# Patient Record
Sex: Female | Born: 1935 | Race: White | Hispanic: No | Marital: Married | Smoking: Former smoker
Health system: Southern US, Community
[De-identification: ages and names within clinical notes are randomized; demographics above are authoritative.]

## PROBLEM LIST (undated history)

## (undated) DIAGNOSIS — H3530 Unspecified macular degeneration: Secondary | ICD-10-CM

## (undated) DIAGNOSIS — M1990 Unspecified osteoarthritis, unspecified site: Secondary | ICD-10-CM

## (undated) DIAGNOSIS — I4891 Unspecified atrial fibrillation: Secondary | ICD-10-CM

## (undated) HISTORY — PX: EYE SURGERY: SHX253

## (undated) HISTORY — PX: STENT PLACEMENT VASCULAR (ARMC HX): HXRAD1737

## (undated) HISTORY — PX: CHOLECYSTECTOMY: SHX55

---

## 2009-01-20 ENCOUNTER — Ambulatory Visit: Payer: Self-pay | Admitting: Internal Medicine

## 2009-01-22 ENCOUNTER — Ambulatory Visit: Payer: Self-pay | Admitting: Internal Medicine

## 2018-12-11 ENCOUNTER — Other Ambulatory Visit: Payer: Self-pay

## 2018-12-11 ENCOUNTER — Encounter: Payer: Self-pay | Admitting: Physician Assistant

## 2018-12-11 ENCOUNTER — Emergency Department: Payer: Medicare Other

## 2018-12-11 ENCOUNTER — Emergency Department
Admission: EM | Admit: 2018-12-11 | Discharge: 2018-12-11 | Disposition: A | Discharge status: Home / Self Care | Payer: Medicare Other | Attending: Emergency Medicine | Admitting: Emergency Medicine

## 2018-12-11 ENCOUNTER — Encounter: Payer: Self-pay | Admitting: Emergency Medicine

## 2018-12-11 DIAGNOSIS — Z7901 Long term (current) use of anticoagulants: Secondary | ICD-10-CM | POA: Diagnosis not present

## 2018-12-11 DIAGNOSIS — Z7982 Long term (current) use of aspirin: Secondary | ICD-10-CM | POA: Insufficient documentation

## 2018-12-11 DIAGNOSIS — R55 Syncope and collapse: Secondary | ICD-10-CM | POA: Insufficient documentation

## 2018-12-11 DIAGNOSIS — Z79899 Other long term (current) drug therapy: Secondary | ICD-10-CM | POA: Diagnosis not present

## 2018-12-11 HISTORY — DX: Unspecified atrial fibrillation: I48.91

## 2018-12-11 HISTORY — DX: Unspecified osteoarthritis, unspecified site: M19.90

## 2018-12-11 HISTORY — DX: Unspecified macular degeneration: H35.30

## 2018-12-11 LAB — BASIC METABOLIC PANEL
Anion gap: 9 (ref 5–15)
BUN: 19 mg/dL (ref 8–23)
CO2: 23 mmol/L (ref 22–32)
Calcium: 8.6 mg/dL — ABNORMAL LOW (ref 8.9–10.3)
Chloride: 104 mmol/L (ref 98–111)
Creatinine, Ser: 0.78 mg/dL (ref 0.44–1.00)
GFR calc Af Amer: >60 mL/min (ref >60)
GFR calc non Af Amer: >60 mL/min (ref >60)
Glucose, Bld: 104 mg/dL — ABNORMAL HIGH (ref 70–99)
Potassium: 3.6 mmol/L (ref 3.5–5.1)
Sodium: 136 mmol/L (ref 135–145)

## 2018-12-11 LAB — URINALYSIS, COMPLETE (UACMP) WITH MICROSCOPIC
Bilirubin Urine: NEGATIVE
Glucose, UA: NEGATIVE mg/dL
Hgb urine dipstick: NEGATIVE
Ketones, ur: NEGATIVE mg/dL
Leukocytes,Ua: NEGATIVE
Nitrite: NEGATIVE
Protein, ur: NEGATIVE mg/dL
Specific Gravity, Urine: 1.004 — ABNORMAL LOW (ref 1.005–1.030)
pH: 6 (ref 5.0–8.0)

## 2018-12-11 LAB — CBC
HCT: 35.1 % — ABNORMAL LOW (ref 36.0–46.0)
Hemoglobin: 11.6 g/dL — ABNORMAL LOW (ref 12.0–15.0)
MCH: 31 pg (ref 26.0–34.0)
MCHC: 33 g/dL (ref 30.0–36.0)
MCV: 93.9 fL (ref 80.0–100.0)
Platelets: 266 10*3/uL (ref 150–400)
RBC: 3.74 MIL/uL — ABNORMAL LOW (ref 3.87–5.11)
RDW: 14.2 % (ref 11.5–15.5)
WBC: 8 10*3/uL (ref 4.0–10.5)
nRBC: 0 % (ref 0.0–0.2)

## 2018-12-11 LAB — TROPONIN I: Troponin I: <0.03 ng/mL (ref <0.03)

## 2018-12-11 NOTE — ED Triage Notes (Signed)
PT arrives via ems from home after near syncope. Pt denies a loc. Pt arrives a&o x 4 and denies pain. Pt reports recurrent episodes of near syncope for the "last few months."

## 2018-12-11 NOTE — ED Provider Notes (Signed)
-----------------------------------------   9:26 PM on 12/11/2018 -----------------------------------------  I evaluated this patient with the PA.  Patient presented with a near syncopal episode in which she slumped over on her side while sitting talking to her family.  She had a similar episode previously.  She did not fully lose consciousness, and did not fall or hit her head.  The patient has a history of atrial fibrillation and also describes an episode prior to moving to [place] which she had a black spot in her vision on the left which was thought to be due to a TIA.  On exam today the patient is very well-appearing for her age.  Her vital signs are normal.  Neuro exam is nonfocal.  The remainder of the exam is unremarkable.  EKG is nonischemic.  Overall presentation is consistent with vasovagal near syncope or other benign etiology.  We obtained a work-up including basic labs, troponin, urinalysis, as well as a CT head, all of which were within normal limits.  Although I have a very low suspicion for TIA based on this presentation, I did offer the patient observation admission overnight.  However the patient has a strong preference to go home and follow-up with her regular doctors as an outpatient.  She was stable at the time of discharge.  Return precautions were given, and she expressed understanding.  ED ECG REPORT I, Dionne Bucy, the attending physician, personally viewed and interpreted this ECG.  Date: 12/11/2018 EKG Time: 1826 Rate: 74 Rhythm: Atrial fibrillation QRS Axis: normal Intervals: normal ST/T Wave abnormalities: Nonspecific repolarization abnormality Narrative Interpretation: no evidence of acute ischemia    Dionne Bucy, MD 12/11/18 2221

## 2018-12-11 NOTE — Discharge Instructions (Addendum)
Your exam, labs, EKG, and CT scan are all within normal limits at this time. There is no clear cause for your near-syncopal episodes. Continue to take your home meds, and follow-up with your provider as needed. Return to the ED for any concerning symptoms.

## 2018-12-11 NOTE — ED Provider Notes (Addendum)
[facility] Emergency Department Provider Note ____________________________________________  Time seen: 2018  I have reviewed the triage vital signs and the nursing notes.  HISTORY  Chief Complaint  Near Syncope  HPI Gina Abbott is a 84 y.o. female presents to the ED via EMS from home where the patient had a witnessed near syncopal episode.  According to the patient, she was sitting outside when her family was around, when she recalls, slumping to the side of her chair.  She reports being awake and aware during the whole episode and denies any head injury or falling.  She is reporting similar episodes over the last month, where she has had near syncopal episodes.  The patient believes the episode occurred because she had gone too long without eating or drinking, after working in the yard this afternoon.  She denies any chest pain, shortness of breath, nausea, vomiting, or dizziness.  She denies any headache, weakness, or distal paresthesias.  Past Medical History:  Diagnosis Date  . Arthritis   . Atrial fibrillation (HCC)   . Macular degeneration     There are no active problems to display for this patient.   Past Surgical History:  Procedure Laterality Date  . CHOLECYSTECTOMY    . EYE SURGERY    . STENT PLACEMENT VASCULAR ([facility] HX)      Prior to Admission medications   Medication Sig Start Date End Date Taking? Authorizing Provider  apixaban (ELIQUIS) 5 MG TABS tablet Take 5 mg by mouth 2 (two) times daily.   Yes [provider]  aspirin EC 81 MG tablet Take 81 mg by mouth at bedtime.   Yes [provider]  atorvastatin (LIPITOR) 40 MG tablet Take 40 mg by mouth at bedtime.   Yes [provider]  b complex vitamins capsule Take 1 capsule by mouth daily.   Yes [provider]  calcium-vitamin D (OSCAL WITH D) 500-200 MG-UNIT tablet Take 1 tablet by mouth.   Yes [provider]  cholecalciferol (VITAMIN D3) 25  MCG (1000 UT) tablet Take 1,000 Units by mouth daily.   Yes [provider]  diltiazem (DILACOR XR) 240 MG 24 hr capsule Take 240 mg by mouth daily.   Yes [provider]  estrogens, conjugated, (PREMARIN) 0.625 MG tablet Take 0.625 mg by mouth 2 (two) times a week.   Yes [provider]  isosorbide dinitrate (ISORDIL) 30 MG tablet Take 30 mg by mouth daily after breakfast.   Yes [provider]  Multiple Vitamin (MULTIVITAMIN WITH MINERALS) TABS tablet Take 1 tablet by mouth daily.   Yes [provider]  Multiple Vitamins-Minerals (PRESERVISION AREDS 2+MULTI VIT) CAPS Take 1 tablet by mouth 2 (two) times a day.   Yes [provider]  omeprazole (PRILOSEC) 40 MG capsule Take 40 mg by mouth daily as needed.   Yes [provider]  oxybutynin (DITROPAN) 5 MG tablet Take 5 mg by mouth daily after supper.   Yes [provider]    Allergies Iodine  No family history on file.  Social History Social History   Tobacco Use  . Smoking status: Not on file  Substance Use Topics  . Alcohol use: Not on file  . Drug use: Not on file    Review of Systems  Constitutional: Negative for fever. Eyes: Negative for visual changes. ENT: Negative for sore throat. Cardiovascular: Negative for chest pain. Respiratory: Negative for shortness of breath. Gastrointestinal: Negative for abdominal pain, vomiting and diarrhea. Genitourinary:  Negative for dysuria. Musculoskeletal: Negative for back pain. Skin: Negative for rash. Neurological: Negative for headaches, focal weakness or numbness. ____________________________________________  PHYSICAL EXAM:  VITAL SIGNS: ED Triage Vitals  Enc Vitals Group     BP 12/11/18 1823 130/67     Pulse Rate 12/11/18 1823 66     Resp 12/11/18 1823 18     Temp 12/11/18 1823 97.7 F (36.5 C)     Temp Source 12/11/18 1823 Oral     SpO2 12/11/18 1823 99 %     Weight 12/11/18 1819 143 lb (64.9 kg)      Height 12/11/18 1819  (1.422 m)     Head Circumference --      Peak Flow --      Pain Score 12/11/18 1819 0     Pain Loc --      Pain Edu? --      Excl. in GC? --     Constitutional: Alert and oriented. Well appearing and in no distress.  Patient is pleasant, interactive, and loquacious. Head: Normocephalic and atraumatic. Eyes: Conjunctivae are normal. PERRL. Normal extraocular movements Ears: Canals clear. TMs intact bilaterally. Mouth/Throat: Mucous membranes are moist. Neck: Supple. Normal ROM Cardiovascular: Normal rate, regular rhythm. Normal distal pulses. Respiratory: Normal respiratory effort. No wheezes/rales/rhonchi. Gastrointestinal: Soft and nontender. No distention. Musculoskeletal: Nontender with normal range of motion in all extremities.  Neurologic:  CN II-XII grossly intact. Normal speech and language. No gross focal neurologic deficits are appreciated. Skin:  Skin is warm, dry and intact. No rash noted. Psychiatric: Mood and affect are normal. Patient exhibits appropriate insight and judgment. ____________________________________________   LABS (pertinent positives/negatives) Labs Reviewed  BASIC METABOLIC PANEL - Abnormal; Notable for the following components:      Result Value   Glucose, Bld 104 (*)    Calcium 8.6 (*)    All other components within normal limits  CBC - Abnormal; Notable for the following components:   RBC 3.74 (*)    Hemoglobin 11.6 (*)    HCT 35.1 (*)    All other components within normal limits  URINALYSIS, COMPLETE (UACMP) WITH MICROSCOPIC - Abnormal; Notable for the following components:   Color, Urine STRAW (*)    APPearance CLEAR (*)    Specific Gravity, Urine 1.004 (*)    Bacteria, UA RARE (*)    All other components within normal limits  TROPONIN I  ____________________________________________  EKG  Atrial Fibrillation Borderline right axis deviation ____________________________________________    RADIOLOGY  Head CT w/o CM IMPRESSION: No acute intracranial abnormality. Mild for age nonspecific cerebral white matter changes are most commonly due to chronic small vessel disease. ____________________________________________  PROCEDURES  Procedures ____________________________________________  INITIAL IMPRESSION / ASSESSMENT AND PLAN / ED COURSE  Gina Abbott was evaluated in Emergency Department on 12/11/2018 for the symptoms described in the history of present illness. She was evaluated in the context of the global COVID-19 pandemic, which necessitated consideration that the patient might be at risk for infection with the SARS-CoV-2 virus that causes COVID-19. Institutional protocols and algorithms that pertain to the evaluation of patients at risk for COVID-19 are in a state of rapid change based on information released by regulatory bodies including the CDC and federal and state organizations. These policies and algorithms were followed during the patient's care in the ED.  Geriatric patient with ED evaluation of a near syncopal episode witnessed by the family.  The patient is been alert & active since her ED arrival.  Labs are reassuring and head CT is negative for any acute intracranial process.  No clear etiology for the patient's near syncopal episode. She recovered completely without further deterioration or sequela.  She is without any acute lab findings, signs of sepsis or toxicity.  She will be discharged at this time to follow with primary provider for ongoing symptoms.  Return precautions have been reviewed.   ____________________________________________  FINAL CLINICAL IMPRESSION(S) / ED DIAGNOSES  Final diagnoses:  Near syncope      Karmen StabsMenshew, Charlesetta IvoryJenise V Bacon, PA-C 12/11/18 [address]2044    Shiane Wenberg, Charlesetta IvoryJenise V Bacon, PA-C 12/11/18 2108    Dionne BucySiadecki, Sebastian, MD 12/11/18 2235

## 2018-12-11 NOTE — ED Notes (Signed)
Patient assisted to use restroom. Ambulatory with steady gait. Specimen obtained and sent to lab. CT at bedside to transport patient for imaging.

## 2018-12-11 NOTE — ED Notes (Signed)
Patient taken to lobby in wheelchair. Verbalized understanding of discharge instructions and follow-up care.

## 2020-07-20 ENCOUNTER — Other Ambulatory Visit: Payer: Self-pay | Admitting: Physician Assistant

## 2020-07-20 ENCOUNTER — Other Ambulatory Visit: Payer: Self-pay | Admitting: Neurosurgery

## 2020-07-20 ENCOUNTER — Other Ambulatory Visit (HOSPITAL_COMMUNITY): Payer: Self-pay | Admitting: Physician Assistant

## 2020-07-20 DIAGNOSIS — S22050A Wedge compression fracture of T5-T6 vertebra, initial encounter for closed fracture: Secondary | ICD-10-CM

## 2020-08-05 ENCOUNTER — Ambulatory Visit (HOSPITAL_COMMUNITY): Payer: Medicare Other

## 2020-08-05 ENCOUNTER — Encounter (HOSPITAL_COMMUNITY): Payer: Self-pay

## 2021-09-23 ENCOUNTER — Other Ambulatory Visit: Payer: Self-pay

## 2021-09-23 ENCOUNTER — Emergency Department: Payer: Medicare Other

## 2021-09-23 ENCOUNTER — Emergency Department
Admission: EM | Admit: 2021-09-23 | Discharge: 2021-09-24 | Disposition: A | Discharge status: Home / Self Care | Payer: Medicare Other | Attending: Emergency Medicine | Admitting: Emergency Medicine

## 2021-09-23 DIAGNOSIS — R0789 Other chest pain: Secondary | ICD-10-CM | POA: Diagnosis present

## 2021-09-23 DIAGNOSIS — R1013 Epigastric pain: Secondary | ICD-10-CM | POA: Diagnosis not present

## 2021-09-23 DIAGNOSIS — E871 Hypo-osmolality and hyponatremia: Secondary | ICD-10-CM

## 2021-09-23 DIAGNOSIS — I4891 Unspecified atrial fibrillation: Secondary | ICD-10-CM | POA: Diagnosis not present

## 2021-09-23 DIAGNOSIS — Z950 Presence of cardiac pacemaker: Secondary | ICD-10-CM | POA: Diagnosis not present

## 2021-09-23 DIAGNOSIS — R079 Chest pain, unspecified: Secondary | ICD-10-CM

## 2021-09-23 LAB — COMPREHENSIVE METABOLIC PANEL
ALT: 21 U/L (ref 0–44)
AST: 25 U/L (ref 15–41)
Albumin: 3.9 g/dL (ref 3.5–5.0)
Alkaline Phosphatase: 102 U/L (ref 38–126)
Anion gap: 5 (ref 5–15)
BUN: 23 mg/dL (ref 8–23)
CO2: 27 mmol/L (ref 22–32)
Calcium: 8.8 mg/dL — ABNORMAL LOW (ref 8.9–10.3)
Chloride: 97 mmol/L — ABNORMAL LOW (ref 98–111)
Creatinine, Ser: 0.99 mg/dL (ref 0.44–1.00)
GFR, Estimated: 56 mL/min — ABNORMAL LOW (ref >60)
Glucose, Bld: 106 mg/dL — ABNORMAL HIGH (ref 70–99)
Potassium: 4 mmol/L (ref 3.5–5.1)
Sodium: 129 mmol/L — ABNORMAL LOW (ref 135–145)
Total Bilirubin: 0.5 mg/dL (ref 0.3–1.2)
Total Protein: 6.8 g/dL (ref 6.5–8.1)

## 2021-09-23 LAB — CBC
HCT: 34.7 % — ABNORMAL LOW (ref 36.0–46.0)
Hemoglobin: 11.4 g/dL — ABNORMAL LOW (ref 12.0–15.0)
MCH: 31.5 pg (ref 26.0–34.0)
MCHC: 32.9 g/dL (ref 30.0–36.0)
MCV: 95.9 fL (ref 80.0–100.0)
Platelets: 233 10*3/uL (ref 150–400)
RBC: 3.62 MIL/uL — ABNORMAL LOW (ref 3.87–5.11)
RDW: 14.9 % (ref 11.5–15.5)
WBC: 8.8 10*3/uL (ref 4.0–10.5)
nRBC: 0 % (ref 0.0–0.2)

## 2021-09-23 LAB — TROPONIN I (HIGH SENSITIVITY)
Troponin I (High Sensitivity): 7 ng/L (ref <18)
Troponin I (High Sensitivity): 7 ng/L (ref <18)

## 2021-09-23 LAB — LIPASE, BLOOD: Lipase: 30 U/L (ref 11–51)

## 2021-09-23 MED ORDER — DIPHENHYDRAMINE HCL 50 MG/ML IJ SOLN: 50.0000 mg | Freq: Once | INTRAMUSCULAR | Status: DC

## 2021-09-23 MED ORDER — ONDANSETRON HCL 4 MG/2ML IJ SOLN
4.0000 mg | Freq: Once | INTRAMUSCULAR | Status: AC
  Administered 2021-09-23: 4 mg via INTRAVENOUS
  Filled 2021-09-23: qty 2

## 2021-09-23 MED ORDER — DIPHENHYDRAMINE HCL 25 MG PO CAPS
50.0000 mg | ORAL_CAPSULE | Freq: Once | ORAL | Status: DC
Start: 2021-09-24 — End: 2021-09-24

## 2021-09-23 MED ORDER — HYDROCORTISONE SOD SUC (PF) 250 MG IJ SOLR
200.0000 mg | Freq: Once | INTRAMUSCULAR | Status: AC
  Administered 2021-09-23: 200 mg via INTRAVENOUS
  Filled 2021-09-23: qty 200

## 2021-09-23 MED ORDER — ALUM & MAG HYDROXIDE-SIMETH 200-200-20 MG/5ML PO SUSP
30.0000 mL | Freq: Once | ORAL | Status: AC
Start: 2021-09-23 — End: 2021-09-23
  Administered 2021-09-23: 30 mL via ORAL
  Filled 2021-09-23: qty 30

## 2021-09-23 MED ORDER — LIDOCAINE VISCOUS HCL 2 % MT SOLN
15.0000 mL | Freq: Once | OROMUCOSAL | Status: AC
  Administered 2021-09-23: 15 mL via ORAL
  Filled 2021-09-23: qty 15

## 2021-09-23 MED ORDER — MORPHINE SULFATE (PF) 4 MG/ML IV SOLN
4.0000 mg | Freq: Once | INTRAVENOUS | Status: AC
  Administered 2021-09-23: 4 mg via INTRAVENOUS
  Filled 2021-09-23: qty 1

## 2021-09-23 NOTE — ED Provider Notes (Signed)
11:30 PM  Assumed care at shift change.  Patient is an 87 year old female who presented to the emergency department with chest pain that has now resolved.  First troponin normal.  Second pending.  Patient and family would prefer discharge home.  She has cardiology outpatient follow-up.  11:41 PM  Pt's second troponin is flat, negative.  Patient still states she is asymptomatic.  Hemodynamically stable at this time.  Patient and family again would like discharge home with close cardiology follow-up.  Discussed return precautions.  Patient and son at bedside comfortable with this plan.   At this time, I do not feel there is any life-threatening condition present. I reviewed all nursing notes, vitals, pertinent previous records.  All lab and urine results, EKGs, imaging ordered have been independently reviewed and interpreted by myself.  I reviewed all available radiology reports from any imaging ordered this visit.  Based on my assessment, I feel the patient is safe to be discharged home without further emergent workup and can continue workup as an outpatient as needed. Discussed all findings, treatment plan as well as usual and customary return precautions with patient and son.  They verbalize understanding and are comfortable with this plan.  Outpatient follow-up has been provided as needed.  All questions have been answered.    Gerard Cantara, Delice Bison, DO 09/23/21 2342

## 2021-09-23 NOTE — ED Notes (Signed)
Pt assisted to toilet in room, ambulatory to toilet with one staff assist. Patient voided approx clear yellow urine. Assisted back to bed. Patient reports no pain since receiving medication.

## 2021-09-23 NOTE — ED Triage Notes (Signed)
Pt c/o chest pain that radiates around to her back that started 16:00 after eating a BBQ sandwich. Pt reports she thought it was indigestion but it did not go away after taking pepto & rolaids.

## 2021-09-23 NOTE — ED Provider Notes (Signed)
[facility] Provider Note    Event Date/Time   First MD Initiated Contact with Patient 09/23/21 2033     (approximate)   History   Chest Pain   HPI  Gina Abbott is a 87 y.o. female with A-fib who comes in with chest pain.  Patient reports chest pain that radiates to her back that started at 4 PM after eating barbecue sandwich.  Patient reports that she ate the barbecue sandwich and then shortly after she started having discomfort in her chest that was then wrapping around the sides of her chest a little bit into her epigastric area.  This pain was constant, a cramping sensation.  Reportedly gets better with exertion.  She reports having a previous stent placed but it was years ago no recent catheterization.  She is follow-up with [facility] cardiology.  Her son is a cardiac nurse and so he wanted her to come to be evaluated to make sure it was not related to her heart.  She does report some history of a tight sphincter in her abdomen that causes some discomfort with eating but states that she has not had any issues with this recently.  She reports taking Tums and Pepto-Bismol without relief in her symptoms.  Denies any shortness of breath.  She has been compliant with her blood thinner which she takes for A-fib.  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 --      Excl. in GC? --     Most recent vital signs: Vitals:    09/23/21 2027  BP: (!) 142/92  Pulse: 98  Resp: 17  Temp: (!) 97.5 F (36.4 C)  SpO2: 100%     General: Awake, no distress.  Pleasant elderly female CV:  Good peripheral perfusion.  Irregular pacemaker noted  Resp:  Normal effort.  Abd:  No distention.  Reports a little epigastric tenderness but no rebound or guarding. Other:  No swelling in her legs   ED Results / Procedures / Treatments   Labs (all labs ordered are listed, but only abnormal results are displayed) Labs Reviewed  CBC  COMPREHENSIVE METABOLIC PANEL  LIPASE, BLOOD  TROPONIN I (HIGH SENSITIVITY)     EKG  My interpretation of EKG:  A-fib rate of 101 without any ST elevations or obvious T wave inversions, normal intervals  RADIOLOGY I have reviewed the xray personally and agree with radiology read negative other then mild cardiomegaly   PROCEDURES:  Critical Care performed: No  .1-3 Lead EKG Interpretation Performed by: 2028, MD Authorized by: Concha Se, MD     Interpretation: abnormal     ECG  rate:  90   ECG rate assessment: normal     Rhythm: atrial fibrillation     Ectopy: none     Conduction: normal     MEDICATIONS ORDERED IN ED: Medications  diphenhydrAMINE (BENADRYL) capsule 50 mg (has no administration in time range)    Or  diphenhydrAMINE (BENADRYL) injection 50 mg (has no administration in time range)  hydrocortisone sodium succinate (SOLU-CORTEF) injection 200 mg (200 mg Intravenous Given 09/23/21 2132)  alum & mag hydroxide-simeth (MAALOX/MYLANTA) 200-200-20 MG/5ML suspension 30 mL (30 mLs Oral Given 09/23/21 2122)    And  lidocaine (XYLOCAINE) 2 % viscous mouth solution 15 mL (15 mLs Oral Given 09/23/21 2122)  morphine (PF) 4 MG/ML injection 4 mg (4 mg Intravenous Given 09/23/21 2124)  ondansetron (ZOFRAN) injection 4 mg (4 mg Intravenous Given 09/23/21 2123)     IMPRESSION / MDM / ASSESSMENT AND PLAN / ED COURSE  I reviewed the triage vital signs and the nursing  notes.  Patient with known A-fib, esophageal issues previously who comes in with concerns for chest pain, little bit of epigastric pain. Pt reports already having gallbladder removed.   Differential diagnosis includes, but is not limited to, ACS, gastritis, GERD, consider the possibility of dissection given the pain radiates from her chest into her abdomen as well as a little bit in her back but patient has contrast allergy.  Denies anaphylaxis so we will prep her in case we need to do a CT scan.  However seems overall unlikely dissection given good radial pulses good pedal pulses sensation intact throughout.  We will try to treat her symptoms with a GI cocktail, get blood pressures in both arms, trial IV morphine and consider imaging  after re-evaluation.   CBC shows stable hemoglobin at 11.4 with normal white count  Blood pressures taken in both arms were equal  Her sodium is slightly low at 129 and chloride slightly low at 97.  Discussed this with family.  We will give 500 cc of fluid and they can follow this up with her primary care doctor. Her CBC shows stable hemoglobin Initial troponin is 7  Reevaluated patient and her chest pain is now gone.  Do not feel that this is a dissection given resolving pain.  She reports that the GI cocktail seem to have helped the most.  I suspect this is some kind of gastritis or esophageal process given it happened after eating the barbecue.  We discussed admission given patient's high risk but they would like to be able to go home assuming the repeat troponin is negative, remains chest pain-free I will follow-up with her cardiologist outpatient.  They understand however that if the symptoms are returning and they need to return to the ER for repeat evaluation.  Patient be handed off to oncoming team pending this repeat troponin   The patient is on the cardiac monitor to evaluate for evidence of arrhythmia and/or significant heart rate changes.  FINAL CLINICAL  IMPRESSION(S) / ED DIAGNOSES   Final diagnoses:  Atypical chest pain     Rx / DC Orders   ED Discharge Orders     None        Note:  This document was prepared using Dragon voice recognition software and may include unintentional dictation errors.   Concha Se, MD 09/23/21 2300

## 2021-09-23 NOTE — Discharge Instructions (Addendum)
Please let your cardiologist know that you have had some chest pain but I suspect that this could be unrelated to the heart  However if you develop return of symptoms please return to the ER for repeat evaluation  Your sodium (129) and chloride (97) were slightly low and this needs to be follow-up with your primary care doctor for recheck in the next 1 to 2 weeks

## 2022-01-22 ENCOUNTER — Ambulatory Visit
Admission: EM | Admit: 2022-01-22 | Discharge: 2022-01-22 | Disposition: A | Discharge status: Home / Self Care | Payer: Medicare Other | Attending: Internal Medicine | Admitting: Internal Medicine

## 2022-01-22 ENCOUNTER — Ambulatory Visit (INDEPENDENT_AMBULATORY_CARE_PROVIDER_SITE_OTHER): Payer: Medicare Other

## 2022-01-22 DIAGNOSIS — S93492A Sprain of other ligament of left ankle, initial encounter: Secondary | ICD-10-CM | POA: Diagnosis not present

## 2022-01-22 DIAGNOSIS — M25572 Pain in left ankle and joints of left foot: Secondary | ICD-10-CM

## 2022-01-22 MED ORDER — PREDNISONE 20 MG PO TABS: 20.0000 mg | ORAL_TABLET | Freq: Every day | ORAL | 0 refills | Status: AC

## 2022-01-22 NOTE — Discharge Instructions (Signed)
Follow with [facility] in [facility]

## 2022-01-22 NOTE — ED Triage Notes (Signed)
Pt present left foot pain,pt state it hurts to stand up on the foot. Symptom started last night but has been going on for two weeks. The pain just comes and goes.

## 2022-01-22 NOTE — ED Provider Notes (Signed)
[facility]    CSN: [id number] Arrival date & time: 01/22/22  1423      History   Chief Complaint Chief Complaint  Patient presents with   Foot Pain    HPI Gina Abbott is a 87 y.o. female who presens with L posterior ankle pain when she stepped off the bed last night to go to the bathrrom, and now is swollen and cant bare much weight. Gets intermittent pains on this foot off and on for the past month and resolved on its own with help of Voltaren and Tylenol. This time the Tylenol did not help.     Past Medical History:  Diagnosis Date   Arthritis    Atrial fibrillation (HCC)    Macular degeneration     There are no problems to display for this patient.   Past Surgical History:  Procedure Laterality Date   CHOLECYSTECTOMY     EYE SURGERY     STENT PLACEMENT VASCULAR ([facility] HX)      OB History   No obstetric history on file.      Home Medications    Prior to Admission medications   Medication Sig Start Date End Date Taking? Authorizing Provider  predniSONE (DELTASONE) 20 MG tablet Take 1 tablet (20 mg total) by mouth daily with breakfast. 01/22/22  Yes Rodriguez-Southworth, Nettie Elm, PA-C  apixaban (ELIQUIS) 5 MG TABS tablet Take 5 mg by mouth 2 (two) times daily.    [provider]  aspirin EC 81 MG tablet Take 81 mg by mouth at bedtime.    [provider]  atorvastatin (LIPITOR) 40 MG tablet Take 40 mg by mouth at bedtime.    [provider]  b complex vitamins capsule Take 1 capsule by mouth daily.    [provider]  calcium-vitamin D (OSCAL WITH D) 500-200 MG-UNIT tablet Take 1 tablet by mouth.    [provider]  cholecalciferol (VITAMIN D3) 25 MCG (1000 UT) tablet Take 1,000 Units by mouth daily.    [provider]  diltiazem (DILACOR XR) 240 MG 24 hr capsule Take 240 mg by mouth daily.    [provider]  estrogens, conjugated, (PREMARIN) 0.625 MG tablet Take 0.625 mg by mouth 2 (two)  times a week.    [provider]  isosorbide dinitrate (ISORDIL) 30 MG tablet Take 30 mg by mouth daily after breakfast.    [provider]  Multiple Vitamin (MULTIVITAMIN WITH MINERALS) TABS tablet Take 1 tablet by mouth daily.    [provider]  Multiple Vitamins-Minerals (PRESERVISION AREDS 2+MULTI VIT) CAPS Take 1 tablet by mouth 2 (two) times a day.    [provider]  omeprazole (PRILOSEC) 40 MG capsule Take 40 mg by mouth daily as needed.    [provider]  oxybutynin (DITROPAN) 5 MG tablet Take 5 mg by mouth daily after supper.    [provider]    Family History History reviewed. No pertinent family history.  Social History Social History   Tobacco Use   Smoking status: Former    Types: Cigarettes   Smokeless tobacco: Never     Allergies   Iodine   Review of Systems Review of Systems  Musculoskeletal:  Positive for arthralgias and joint swelling.  Skin:  Negative for color change, rash and wound.     Physical Exam Triage Vital Signs ED Triage Vitals [01/22/22 1450]  Enc Vitals Group     BP (!) 149/72     Pulse  Rate 72     Resp 16     Temp 98 F (36.7 C)     Temp Source Oral     SpO2 100 %     Weight      Height      Head Circumference      Peak Flow      Pain Score 7     Pain Loc      Pain Edu?      Excl. in GC?    No data found.  Updated Vital Signs BP (!) 149/72 (BP Location: Right Arm)   Pulse 72   Temp 98 F (36.7 C) (Oral)   Resp 16   SpO2 100%   Visual Acuity Right Eye Distance:   Left Eye Distance:   Bilateral Distance:    Right Eye Near:   Left Eye Near:    Bilateral Near:     Physical Exam Vitals and nursing note reviewed.  Constitutional:      General: She is not in acute distress. HENT:     Right Ear: External ear normal.     Left Ear: External ear normal.  Eyes:     General: No scleral icterus.    Conjunctiva/sclera: Conjunctivae normal.  Cardiovascular:      Pulses: Normal pulses.  Pulmonary:     Effort: Pulmonary effort is normal.  Musculoskeletal:        General: Normal range of motion.     Comments: L ANKLE- with moderate swelling, and local tenderness behind medial malleolus. Achillis tender is non tender and is normal. ROM is normal.  L FOOT- with mild swelling, not tender on metatarsals.   Skin:    General: Skin is warm and dry.     Capillary Refill: Capillary refill takes less than 2 seconds.     Findings: No rash.  Neurological:     Mental Status: She is alert and oriented to person, place, and time.     Gait: Gait abnormal.     Comments: Limping  Psychiatric:        Mood and Affect: Mood normal.        Behavior: Behavior normal.        Thought Content: Thought content normal.        Judgment: Judgment normal.      UC Treatments / Results  Labs (all labs ordered are listed, but only abnormal results are displayed) Labs Reviewed - No data to display  EKG   Radiology DG Ankle Complete Left  Result Date: 01/22/2022 CLINICAL DATA:  Posterior ankle pain EXAM: LEFT ANKLE COMPLETE - 3+ VIEW COMPARISON:  None Available. FINDINGS: There is no evidence of acute fracture. There is soft tissue swelling of the ankle. There is mild tibiotalar osteoarthritis. There is lucency in the medial talar dome consistent with osteochondral defect. Plantar calcaneal spurring and calcification along the plantar fascia proximally. There is moderate midfoot arthritis. Small Haglund deformity. IMPRESSION: No evidence of acute fracture.  Ankle soft tissue swelling. 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 Findings of chronic proximal plantar fasciitis. Moderate midfoot osteoarthritis. Electronically Signed   By: Caprice RenshawJacob  Kahn M.D.   On: 01/22/2022 15:40    Procedures Procedures (including critical care time)  Medications Ordered in UC Medications - No data to display  Initial Impression / Assessment and Plan /  UC Course  I have reviewed the triage vital signs and the nursing notes.  Pertinent  imaging results that were  available during my care of the patient were reviewed by me and considered in my medical decision making (see chart for details).  L ankle sprain  Placed on a ankle brace and Prednisone as noted.  Needs to FU with ortho.    Final Clinical Impressions(s) / UC Diagnoses   Final diagnoses:  High ankle sprain, left, initial encounter     Discharge Instructions      Follow with [facility] in [facility]     ED Prescriptions     Medication Sig Dispense Auth. Provider   predniSONE (DELTASONE) 20 MG tablet Take 1 tablet (20 mg total) by mouth daily with breakfast. 3 tablet Rodriguez-Southworth, Nettie Elm, PA-C      PDMP not reviewed this encounter.   Garey Ham, PA-C 01/22/22 1703

## 2022-07-03 IMAGING — DX DG ANKLE COMPLETE 3+V*L*
3 series · 3 of 3 positions shown · non-contrast
Comparison: None Available.

CLINICAL DATA: Posterior ankle pain

EXAM:
LEFT ANKLE COMPLETE - 3+ VIEW

[ankle ap]
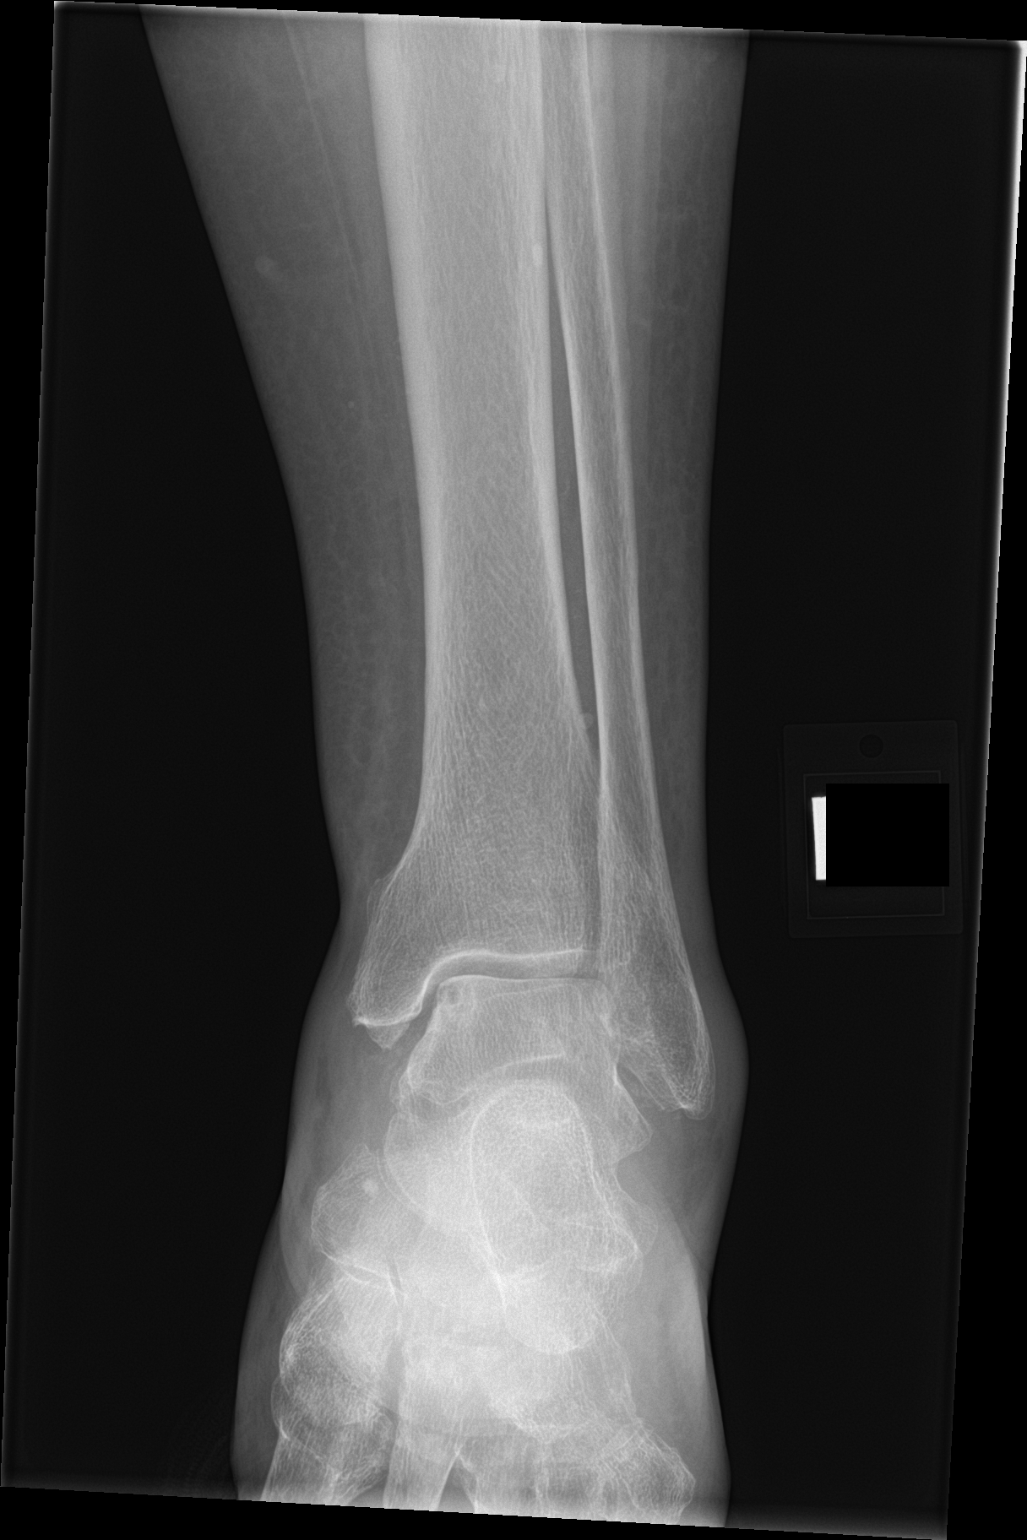

[ankle obl]
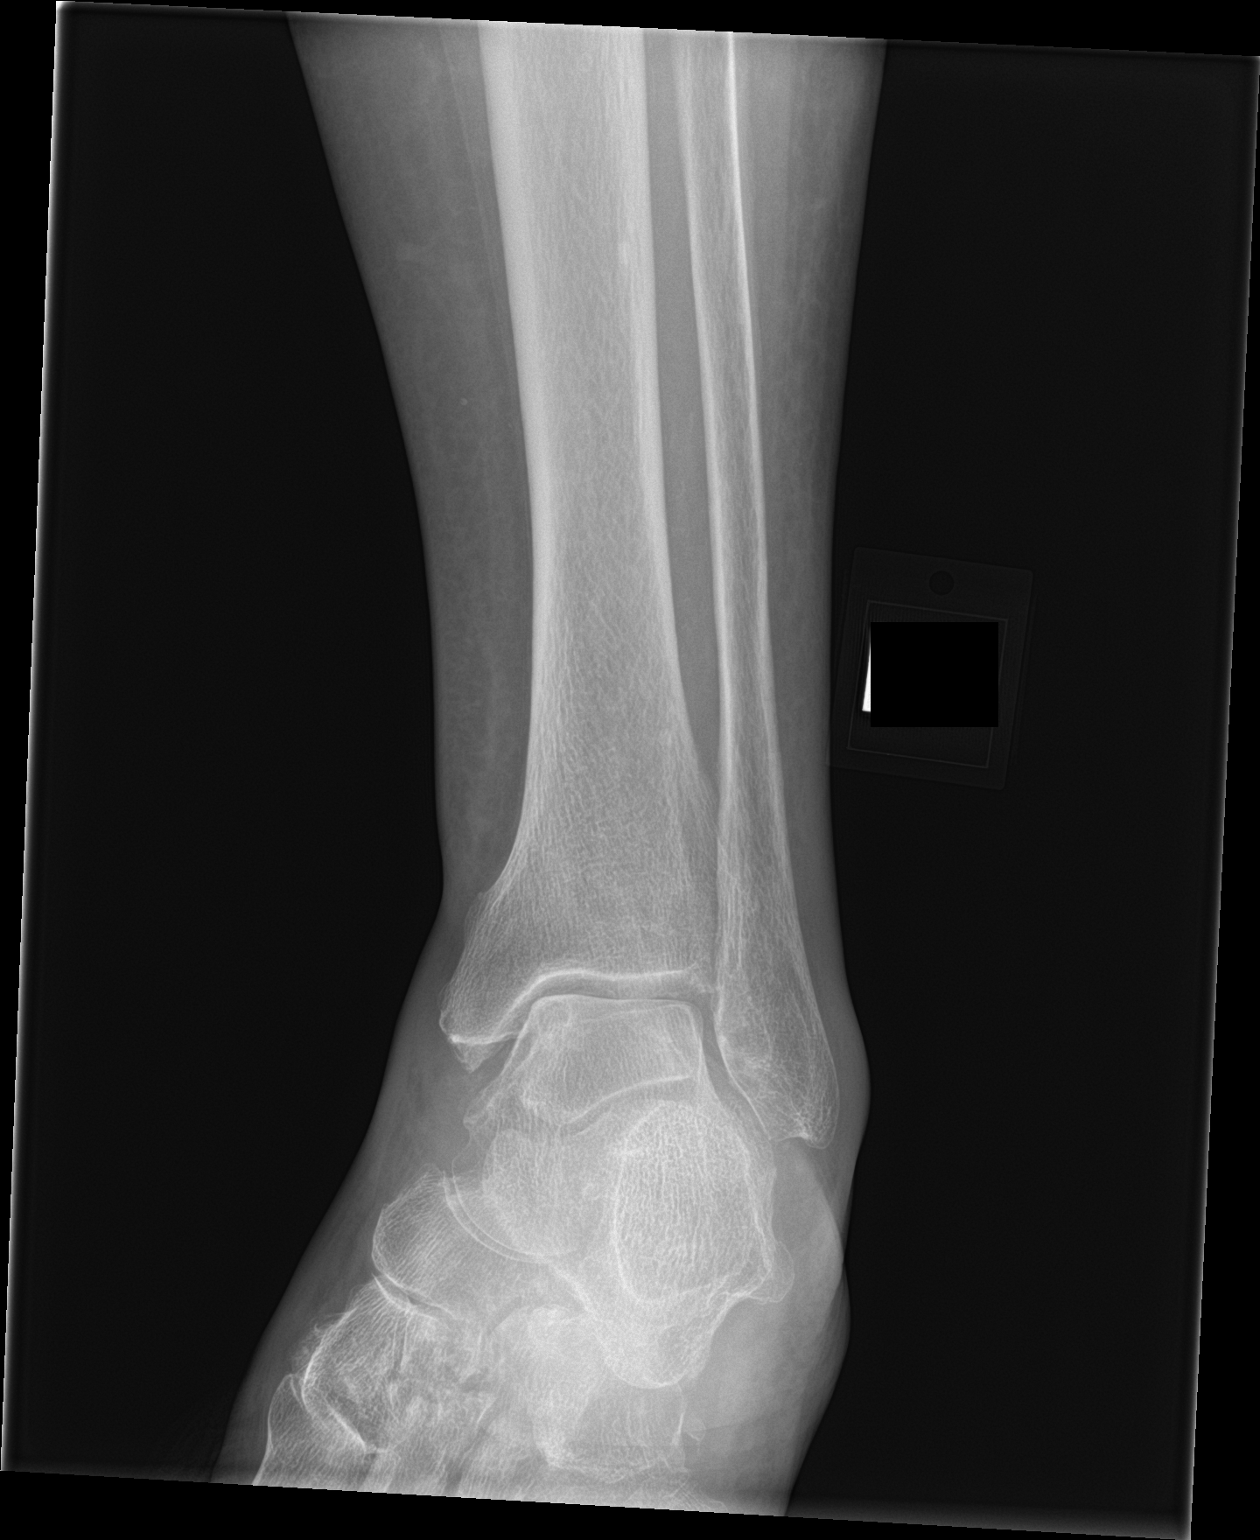

[ankle lat]
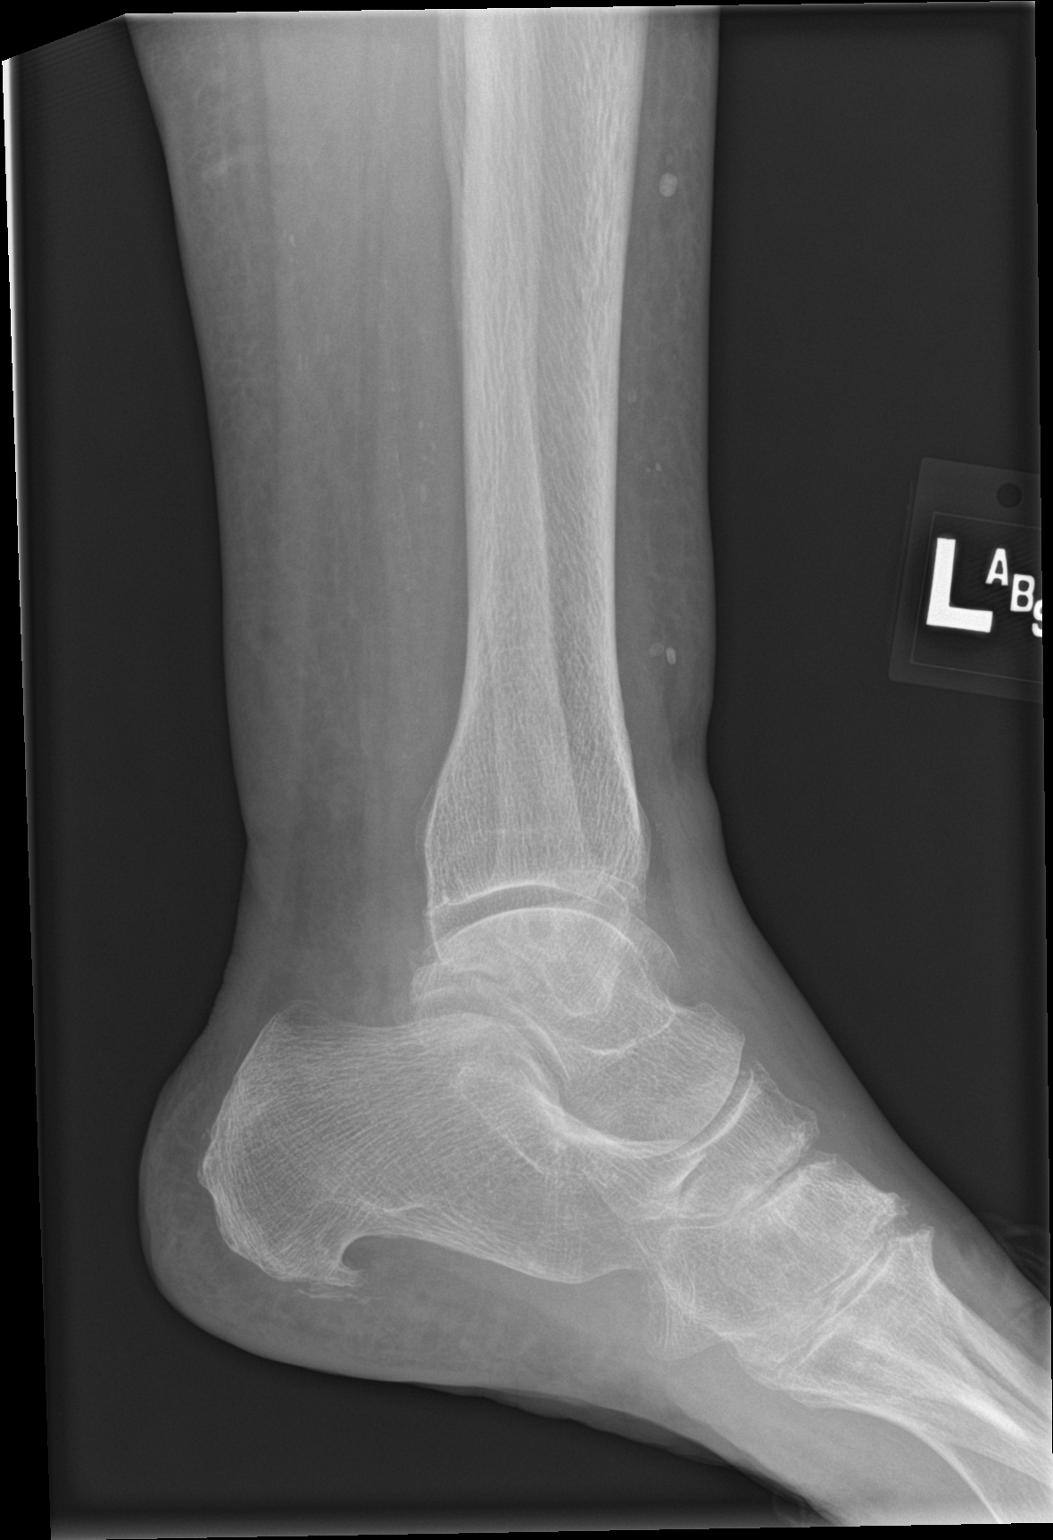

[3 of 3 positions shown; findings below may reference images not displayed]

FINDINGS: There is no evidence of acute fracture. There is soft tissue
swelling of the ankle. There is mild tibiotalar osteoarthritis.
There is lucency in the medial talar dome consistent with
osteochondral defect. Plantar calcaneal spurring and calcification
along the plantar fascia proximally. There is moderate midfoot
arthritis. Small Haglund deformity.
IMPRESSION: No evidence of acute fracture.  Ankle soft tissue swelling.

Mild tibiotalar osteoarthritis with osteochondral defect of the
medial talar dome.

Findings of chronic proximal plantar fasciitis.

Moderate midfoot osteoarthritis.

## 2023-04-16 DEATH — deceased
